# Patient Record
Sex: Male | Born: 2000 | Race: Black or African American | Hispanic: No | Marital: Single | Smoking: Never smoker
Health system: Southern US, Community
[De-identification: ages and names within clinical notes are randomized; demographics above are authoritative.]

---
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
## 2023-05-25 ENCOUNTER — Encounter: Payer: Self-pay | Admitting: Emergency Medicine

## 2023-05-25 ENCOUNTER — Emergency Department
Admission: EM | Admit: 2023-05-25 | Discharge: 2023-05-25 | Disposition: A | Discharge status: Home / Self Care | Payer: Worker's Comp, Other unspecified | Attending: Emergency Medicine | Admitting: Emergency Medicine

## 2023-05-25 ENCOUNTER — Other Ambulatory Visit: Payer: Self-pay

## 2023-05-25 DIAGNOSIS — S0990XA Unspecified injury of head, initial encounter: Secondary | ICD-10-CM | POA: Insufficient documentation

## 2023-05-25 DIAGNOSIS — Y92512 Supermarket, store or market as the place of occurrence of the external cause: Secondary | ICD-10-CM | POA: Insufficient documentation

## 2023-05-25 DIAGNOSIS — Y990 Civilian activity done for income or pay: Secondary | ICD-10-CM | POA: Insufficient documentation
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
## 2023-06-03 ENCOUNTER — Emergency Department: Payer: Worker's Compensation

## 2023-06-03 ENCOUNTER — Emergency Department
Admission: EM | Admit: 2023-06-03 | Discharge: 2023-06-03 | Disposition: A | Discharge status: Home / Self Care | Payer: Worker's Compensation | Attending: Emergency Medicine | Admitting: Emergency Medicine

## 2023-06-03 ENCOUNTER — Other Ambulatory Visit: Payer: Self-pay

## 2023-06-03 DIAGNOSIS — M549 Dorsalgia, unspecified: Secondary | ICD-10-CM

## 2023-06-03 DIAGNOSIS — Z23 Encounter for immunization: Secondary | ICD-10-CM | POA: Insufficient documentation

## 2023-06-03 DIAGNOSIS — S0990XA Unspecified injury of head, initial encounter: Secondary | ICD-10-CM | POA: Insufficient documentation

## 2023-06-03 DIAGNOSIS — Y990 Civilian activity done for income or pay: Secondary | ICD-10-CM | POA: Insufficient documentation

## 2023-06-03 DIAGNOSIS — M546 Pain in thoracic spine: Secondary | ICD-10-CM | POA: Diagnosis not present

## 2023-06-03 DIAGNOSIS — W1839XA Other fall on same level, initial encounter: Secondary | ICD-10-CM | POA: Insufficient documentation

## 2023-06-03 MED ORDER — TETANUS-DIPHTH-ACELL PERTUSSIS 5-2.5-18.5 LF-MCG/0.5 IM SUSY
0.5000 mL | PREFILLED_SYRINGE | Freq: Once | INTRAMUSCULAR | Status: AC
  Administered 2023-06-03: 0.5 mL via INTRAMUSCULAR
  Filled 2023-06-03: qty 0.5

## 2023-06-03 NOTE — ED Triage Notes (Signed)
Pt reports that a metal grid fell on his head while at work. Denies LOC. Reports occurred 1 hr pta. Pt reports headache at site of impact. Denies dizziness, vision change, parasthesia, n/v. Reports pain to L shoulder when he rotates it. Small laceration to posterior skull. No active bleeding.  Pt ambulatory to triage. Alert and oriented following commands. Breathing unlabored speaking in full sentences. Symmetric chest rise and fall.

## 2023-06-03 NOTE — Discharge Instructions (Addendum)
You were seen in the emergency department today for evaluation after your head injury.  Your imaging was fortunately reassuring.  You can take Tylenol and ibuprofen as needed to help with your pain.  Follow up with you primary care doctor within a few days if your symptoms have not improved.  It is still possible that you could have a concussion.  Return to the ER for new or worsening symptoms.
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
## 2024-01-21 ENCOUNTER — Ambulatory Visit (LOCAL_COMMUNITY_HEALTH_CENTER)

## 2024-01-21 DIAGNOSIS — Z111 Encounter for screening for respiratory tuberculosis: Secondary | ICD-10-CM

## 2024-01-24 ENCOUNTER — Ambulatory Visit (LOCAL_COMMUNITY_HEALTH_CENTER)

## 2024-01-24 DIAGNOSIS — Z111 Encounter for screening for respiratory tuberculosis: Secondary | ICD-10-CM

## 2024-01-24 LAB — TB SKIN TEST
Induration: 0 mm
TB Skin Test: NEGATIVE
